# Patient Record
Sex: Male | Born: 2000 | Race: White | Hispanic: No | Marital: Single
Health system: Southern US, Community
[De-identification: ages and names within clinical notes are randomized; demographics above are authoritative.]

---

## 2020-07-08 ENCOUNTER — Emergency Department (HOSPITAL_COMMUNITY)
Admission: EM | Admit: 2020-07-08 | Discharge: 2020-07-08 | Disposition: A | Discharge status: Home / Self Care | Payer: BC Managed Care – PPO | Attending: Emergency Medicine | Admitting: Emergency Medicine

## 2020-07-08 ENCOUNTER — Other Ambulatory Visit: Payer: Self-pay

## 2020-07-08 ENCOUNTER — Emergency Department (HOSPITAL_COMMUNITY): Payer: BC Managed Care – PPO

## 2020-07-08 ENCOUNTER — Encounter (HOSPITAL_COMMUNITY): Payer: Self-pay

## 2020-07-08 DIAGNOSIS — M545 Low back pain, unspecified: Secondary | ICD-10-CM | POA: Diagnosis not present

## 2020-07-08 DIAGNOSIS — M546 Pain in thoracic spine: Secondary | ICD-10-CM | POA: Insufficient documentation

## 2020-07-08 DIAGNOSIS — M549 Dorsalgia, unspecified: Secondary | ICD-10-CM | POA: Diagnosis present

## 2020-07-08 MED ORDER — CYCLOBENZAPRINE HCL 10 MG PO TABS: 10.0000 mg | ORAL_TABLET | Freq: Three times a day (TID) | ORAL | 0 refills | Status: AC
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
